# Patient Record
Sex: Male | Born: 1960 | Race: White | Hispanic: No | Marital: Married | Smoking: Never smoker
Health system: Southern US, Community
[De-identification: ages and names within clinical notes are randomized; demographics above are authoritative.]

---

## 2017-01-26 ENCOUNTER — Ambulatory Visit (INDEPENDENT_AMBULATORY_CARE_PROVIDER_SITE_OTHER): Payer: 59

## 2017-01-26 ENCOUNTER — Encounter: Payer: Self-pay | Admitting: Sports Medicine

## 2017-01-26 ENCOUNTER — Ambulatory Visit (INDEPENDENT_AMBULATORY_CARE_PROVIDER_SITE_OTHER): Payer: 59 | Admitting: Sports Medicine

## 2017-01-26 VITALS — BP 136/92 | HR 81 | Ht 74.0 in | Wt 211.4 lb

## 2017-01-26 DIAGNOSIS — M6259 Muscle wasting and atrophy, not elsewhere classified, multiple sites: Secondary | ICD-10-CM

## 2017-01-26 DIAGNOSIS — M545 Low back pain, unspecified: Secondary | ICD-10-CM

## 2017-01-26 DIAGNOSIS — M25562 Pain in left knee: Secondary | ICD-10-CM

## 2017-01-26 DIAGNOSIS — M6258 Muscle wasting and atrophy, not elsewhere classified, other site: Secondary | ICD-10-CM

## 2017-01-26 MED ORDER — DICLOFENAC SODIUM 2 % TD SOLN: 1.0000 "application " | Freq: Two times a day (BID) | TRANSDERMAL | 2 refills | Status: AC

## 2017-01-26 NOTE — Progress Notes (Signed)
Walter Cameron - 56 y.o. male MRN [id number]  Date of birth: 1961/03/10  Office Visit Note: Visit Date: 01/26/2017 [facility] Referred by: Medicine, [facility]*  Subjective: Chief Complaint  Patient presents with  . pain in knee    left   HPI: Patient reports for evaluation of left generalized knee pain.  He reports exquisite amount of pain in his low back and left leg that occurred approximately 2 months ago.  He had good resolution of the symptoms with chiropractic care is no longer having significant pain in his back hip or leg.  He has however noticed focal significant atrophy and weakness in the left leg especially within the left VMO and has had difficulty with one leg and knee extensions are he has had a decreased weight progressively over the past several weeks.  He is having occasional discomfort related more so to weakness with the left knee without overt locking or giving way.  He has not tried taking any specific medications given overall only minimal pain.  He has never had any issues like this before.  Only intermittent problems with his back in the past however once again no pain at this time.  He has noticed weakness going up and down steps as well as with knee extension.  He is quite fit and active and does workout on a daily basis.  He does report having a quite large femoral hernia that has previously been diagnosed although he has been putting off intervention due to lack of discomfort or pain.  He denies any nausea or vomiting.  No changes in bowel or bladder.  Does not think the hernia has anything to do with what is going on but did want to make sure we were aware of this condition.  ROS: Denies fevers, chills, recent weight gain or weight loss.  No night sweats. No significant nighttime awakenings due to this issue.  Otherwise per HPI.  Objective:  VS:  HT:6\' 2"  (188 cm)   WT:211 lb 6.4 oz (95.9 kg)  BMI:27.2    BP:(!) 136/92   HR:81bpm  TEMP: ( )  RESP:98 % Physical Exam: GENERAL:  WDWN, NAD, Non-toxic appearing PSYCH:  Alert & appropriately interactive  Not depressed or anxious appearing LEFT KNEE AND BACK:   No significant rashes/lesions/ulcerations overlying the legs.  No significant bruising or scarring  He has marked atrophy of the left VMO with generalized atrophy of the entire leg at the quad level.    No significant pretibial edema.  No clubbing or cyanosis.  DP & PT pulses 2+/4.  LE Sensation intact to light touch although he does have a slight dysesthesia in the anterior thigh to light touch.  He has very good (1 cm) 2 point discrimination in the L1 and L2 dermatomes bilaterally.  Overall joint is well aligned, no significant deformity.    No significant effusion.    ROM: 0 to 120.     Extensor mechanism intact however he has 3 out of 5 weakness with hip flexion.  Does have a difficult time performing one legged squat although this is nonpainful for him.  No significant medial or lateral joint line tenderness.    Stable to varus/valgus strain & anterior/posterior drawer.  Normal Lachman's.    Difficulty with [facility] although no significant pain or mechanical symptoms.  Negative McMurray's.  Negative patellar grind.  No pain with femoral stretch test or straight leg raise. Abdomen/pelvic:  He does have a easily  reproducible inguinal hernia with possible femoral component.  This is nonpainful and easily reduces with rest.  Imaging & Procedures: Dg Thoracic Spine W/swimmers  Result Date: 01/26/2017 CLINICAL DATA:  Back pain. EXAM: THORACIC SPINE - 3 VIEWS COMPARISON:  No recent prior . FINDINGS: Diffuse degenerative change thoracic spine. Minimal compression of lower thoracic vertebral bodies cannot be excluded. Age is undetermined . No prominent compression fracture noted . IMPRESSION: 1. Minimal compression of lower thoracic vertebral body compression fractures cannot be  excluded. Age is undetermined. No prominent compression fracture noted. 2. Diffuse multilevel degenerative change. Electronically Signed   By: Maisie Fus  Register   On: 01/26/2017 10:33   Dg Lumbar Spine Complete  Result Date: 01/26/2017 CLINICAL DATA:  Intermittent bilateral lower back and left leg pain. EXAM: LUMBAR SPINE - COMPLETE 4+ VIEW COMPARISON:  None. FINDINGS: No acute fracture or spondylolisthesis is noted. Moderate degenerative disc disease is noted at L1-2 with anterior osteophyte formation. 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 Right-sided pars defect of L5. No acute abnormality seen in the lumbar spine. Electronically Signed   By: Lupita Raider, M.D.   On: 01/26/2017 09:57   Assessment & Plan: Problem List Items Addressed This Visit    Acute bilateral low back pain without sciatica - Primary    Patient had acute onset of pain approximately 2 months ago with no progressive worsening of left knee pain and noted atrophy of the left leg.  Given he is completely asymptomatic but having worsening atrophy as well as worsening strength I am concerned for potential significant nerve root impingement.  I do not see any signs of any type of upper motor neuron disorder.  He does have a very large left inguinal/femoral hernia but given no pain with direct palpation or femoral stretch test do not suspect this is causing acute compression of the femoral nerve although this would be within the realm of possibility.    I would like to begin with a nerve conduction study/EMG of the left lower extremity to ensure that there is no significant denervation injury resulting in the atrophy.  Pending findings on EMG/nerve conduction study would consider further advanced imaging of his lumbar spine .      Relevant Medications   Diclofenac Sodium (PENNSAID) 2 % SOLN   Other Relevant Orders   DG Lumbar Spine Complete (Completed)   DG Thoracic Spine  W/Swimmers (Completed)   Ambulatory referral to Physical Medicine Rehab   Left knee pain    No focal or reproducible mechanical symptoms per history or on exam.  I suspect this is related more to some muscle imbalance with the resultant VMO atrophy.  After further evaluation of the atrophy could consider further diagnostic workup of the knee from a structural standpoint if persistent symptoms without explanation.  He is only having minimal symptoms we will let him try a sample of Pennsaid to see if this is helpful as well.  Instructions were reviewed.      Muscle atrophy of lower extremity   Relevant Medications   Diclofenac Sodium (PENNSAID) 2 % SOLN   Other Relevant Orders   DG Lumbar Spine Complete (Completed)   DG Thoracic Spine W/Swimmers (Completed)   Ambulatory referral to Physical Medicine Rehab      Follow-up: Return for Also, we will call you about your results.   Past Medical/Family/Surgical/Social History: Medications & Allergies reviewed per EMR Patient Active Problem List   Diagnosis  Date Noted  . Muscle atrophy of lower extremity 01/27/2017  . Acute bilateral low back pain without sciatica 01/27/2017  . Left knee pain 01/27/2017   No past medical history on file. No family history on file. No past surgical history on file. Social History   Occupational History  . Not on file.   Social History Main Topics  . Smoking status: Never Smoker  . Smokeless tobacco: Never Used  . Alcohol use Not on file  . Drug use: Unknown  . Sexual activity: Not on file

## 2017-01-26 NOTE — Patient Instructions (Signed)
We are setting up for nerve conduction study to further evaluate your muscle weakness.  You can try using the topical medicine that I gave you twice per day to see if this is helpful.

## 2017-01-27 DIAGNOSIS — M6258 Muscle wasting and atrophy, not elsewhere classified, other site: Secondary | ICD-10-CM | POA: Insufficient documentation

## 2017-01-27 DIAGNOSIS — M545 Low back pain, unspecified: Secondary | ICD-10-CM | POA: Insufficient documentation

## 2017-01-27 DIAGNOSIS — M25562 Pain in left knee: Secondary | ICD-10-CM | POA: Insufficient documentation

## 2017-01-27 NOTE — Assessment & Plan Note (Signed)
Patient had acute onset of pain approximately 2 months ago with no progressive worsening of left knee pain and noted atrophy of the left leg.  Given he is completely asymptomatic but having worsening atrophy as well as worsening strength I am concerned for potential significant nerve root impingement.  I do not see any signs of any type of upper motor neuron disorder.  He does have a very large left inguinal/femoral hernia but given no pain with direct palpation or femoral stretch test do not suspect this is causing acute compression of the femoral nerve although this would be within the realm of possibility.    I would like to begin with a nerve conduction study/EMG of the left lower extremity to ensure that there is no significant denervation injury resulting in the atrophy.  Pending findings on EMG/nerve conduction study would consider further advanced imaging of his lumbar spine .

## 2017-01-27 NOTE — Assessment & Plan Note (Signed)
No focal or reproducible mechanical symptoms per history or on exam.  I suspect this is related more to some muscle imbalance with the resultant VMO atrophy.  After further evaluation of the atrophy could consider further diagnostic workup of the knee from a structural standpoint if persistent symptoms without explanation.  He is only having minimal symptoms we will let him try a sample of Pennsaid to see if this is helpful as well.  Instructions were reviewed.

## 2017-02-09 ENCOUNTER — Encounter (INDEPENDENT_AMBULATORY_CARE_PROVIDER_SITE_OTHER): Payer: Self-pay | Admitting: Physical Medicine and Rehabilitation

## 2017-02-19 ENCOUNTER — Ambulatory Visit (INDEPENDENT_AMBULATORY_CARE_PROVIDER_SITE_OTHER): Payer: 59 | Admitting: Physical Medicine and Rehabilitation

## 2017-02-19 ENCOUNTER — Encounter (INDEPENDENT_AMBULATORY_CARE_PROVIDER_SITE_OTHER): Payer: Self-pay | Admitting: Physical Medicine and Rehabilitation

## 2017-02-19 DIAGNOSIS — R202 Paresthesia of skin: Secondary | ICD-10-CM

## 2017-02-19 NOTE — Progress Notes (Signed)
Walter Cameron - 56 y.o. male MRN [id number]  Date of birth: 04-01-1961  Office Visit Note: Visit Date: 02/19/2017 PCP: Andrena Mews, DO Referred by: Medicine, [facility]*  Subjective: Chief Complaint  Patient presents with  . Left Leg - Tingling   HPI: Mr. Vicente Masson is a pleasant and active 56 year old right-hand dominant but left leg dominant gentleman who comes in today with complaints of weakness and atrophy of the left quadriceps muscle. He reports that 2-3 months ago he was doing been over rows as part of her work out and felt a pull muscle in the lower back and buttock region. He had chiropractic care along with dry needling. He states that the dry needling essentially made the pain in the left lower back go away completely. Since that time he noticed progressive weakness in the left leg where he was having a hard time doing leg extensions and feeling weaker. The pain is completely resolved but he did for weaker in the left leg and knee. He has been running some lately. He has continued his workouts. He does feel like he has gotten some stronger. He has had a history of mild back problems in the past off and on. He has never had any lumbar spine surgery or radicular complaints. He does not have any paresthesias down the legs or focal weakness in the feet but he does report a bit of a tingly sensation on the medial knee.Marland Kitchen He is not a diabetic. He is otherwise healthy. He has not had prior electrodiagnostic studies.    Left lower extremity. Had some pain, now tingling sensation medial left knee. Has been having symptoms since early February when he pulled a muscle in his upper glute. Had dry needling which helped with the pain, but still has the tingling sensation. ROS Otherwise per HPI.  Assessment & Plan: Visit Diagnoses:  1. Paresthesia of skin     Plan: No additional findings.  Impression: The above electrodiagnostic study is ABNORMAL and reveals evidence of moderate to  severe subacute L3 radiculopathy on the left.  There is no significant electrodiagnostic evidence of any other focal nerve entrapment. Given the lack of symptoms in the full femoral nerve distribution, a femoral nerve, nerve conduction study was not performed. In hindsight, lumbar paraspinal EMG may have increased the specificity of the test. Clinically, the symptoms fit more with a radiculopathy. Neurogenic amyotrophy cannot be completely ruled out but should recover given the findings on this test.  Recommendations: 1.  Follow-up with referring physician. 2.  Continue current management of symptoms. Repeat electrodiagnostic study and 2-3 months of symptoms persist or progress. 3.  Suggest lumbar spine MRI to confirm source of radiculopathy. With continued improvement of the patient's strength, no red flag symptoms and the patient having really no pain at this point, watchful waiting would also be acceptable. Prognostically, he should regain a lot of strength over time.   Meds & Orders: No orders of the defined types were placed in this encounter.   Orders Placed This Encounter  Procedures  . NCV with EMG (electromyography)    Follow-up: Return for Dr. Berline Chough.   Procedures: No procedures performed  EMG & NCV Findings: All nerve conduction studies (as indicated in the following tables) were within normal limits.    The muscle scoring table definition stored in the current test does not match the sentence generator setup.  The sentence could not be generated.  Impression: The above electrodiagnostic study is ABNORMAL and reveals evidence  of moderate to severe subacute L3 radiculopathy on the left.  There is no significant electrodiagnostic evidence of any other focal nerve entrapment. Given the lack of symptoms in the full femoral nerve distribution, a femoral nerve, nerve conduction study was not performed. In hindsight, lumbar paraspinal EMG may have increased the specificity of the test.  Clinically, the symptoms fit more with a radiculopathy. Neurogenic amyotrophy cannot be completely ruled out but should recover given the findings on this test.  Recommendations: 1.  Follow-up with referring physician. 2.  Continue current management of symptoms. Repeat electrodiagnostic study and 2-3 months of symptoms persist or progress. 3.  Suggest lumbar spine MRI to confirm source of radiculopathy. With continued improvement of the patient's strength, no red flag symptoms and the patient having really no pain at this point, watchful waiting would also be acceptable.      Nerve Conduction Studies Motor Summary Table   Stim Site NR Onset (ms) Norm Onset (ms) O-P Amp (mV) Norm O-P Amp Site1 Site2 Delta-0 (ms) Dist (cm) Vel (m/s) Norm Vel (m/s)  Left Fibular Motor (Ext Dig Brev)  30.4C  Ankle    3.4 <6.1 7.3 >2.5 B Fib Ankle 8.3 38.0 46 >38  B Fib    11.7  6.7  Poplt B Fib 2.7 12.0 44 >40  Poplt    14.4  5.1         Left Tibial Motor (Abd Hall Brev)  29.9C  Ankle    4.3 <6.1 16.5 >3.0 Knee Ankle 10.2 46.0 45 >35  Knee    14.5  10.1           EMG   Side Muscle Nerve Root Ins Act Fibs Psw Amp Dur Poly Recrt Int Dennie Bible Comment  Left AntTibialis Dp Br Peron L4-5 Nml Nml Nml Nml Nml 0 Nml Nml   Left Fibularis Longus  Sup Br Peron L5-S1 Nml Nml Nml Nml Nml 0 Nml Nml   Left MedGastroc Tibial S1-2 Nml Nml Nml Nml Nml 0 Nml Nml   Left VastusMed Femoral L2-4 *Incr Nml Nml *Incr Nml 0 *Reduced Nml   Left BicepsFemS Sciatic L5-S1 Nml Nml Nml Nml Nml 0 Nml Nml   Left VastusLat Femoral L2-4 *Incr *3+ *3+ *Incr Nml 0 *Reduced Nml   Left RectFemoris Femoral L2-4 *Incr *2+ *2+ *Incr Nml 0 *Reduced Nml     Nerve Conduction Studies Motor Left/Right Comparison   Stim Site L Lat (ms) R Lat (ms) L-R Lat (ms) L Amp (mV) R Amp (mV) L-R Amp (%) Site1 Site2 L Vel (m/s) R Vel (m/s) L-R Vel (m/s)  Fibular Motor (Ext Dig Brev)  30.4C  Ankle 3.4   7.3   B Fib Ankle 46    B Fib 11.7   6.7   Poplt B Fib  44    Poplt 14.4   5.1         Tibial Motor (Abd Hall Brev)  29.9C  Ankle 4.3   16.5   Knee Ankle 45    Knee 14.5   10.1            Waveforms:       Clinical History: No specialty comments available.  He reports that he has never smoked. He has never used smokeless tobacco. No results for input(s): HGBA1C, LABURIC in the last 8760 hours.  Objective:  VS:  HT:    WT:   BMI:     BP:   HR: bpm  TEMP: ( )  RESP:  Physical Exam  Musculoskeletal:  The patient is a tall fairly fit individual who ambulates without aid with a normal gait. Inspection of both thighs does show atrophy of the rectus femoris and vastus medialis on the left compared to right. He reports that he is left leg dominant. There is normal bulk and muscle tone symmetrically below the knee bilaterally. He has no edema bilaterally. He has 5 out of 5 strength with dorsiflexion plantarflexion EHL bilaterally. He has no clonus bilaterally has downgoing toes. There are no observed fasciculations. He is mildly weaker with the left hip and knee extension compared to right but he is fairly strong. There is normal intact sensation to light touch.    Ortho Exam Imaging: No results found.  Past Medical/Family/Surgical/Social History: Medications & Allergies reviewed per EMR Patient Active Problem List   Diagnosis Date Noted  . Muscle atrophy of lower extremity 01/27/2017  . Acute bilateral low back pain without sciatica 01/27/2017  . Left knee pain 01/27/2017   History reviewed. No pertinent past medical history. History reviewed. No pertinent family history. History reviewed. No pertinent surgical history. Social History   Occupational History  . Not on file.   Social History Main Topics  . Smoking status: Never Smoker  . Smokeless tobacco: Never Used  . Alcohol use Not on file  . Drug use: Unknown  . Sexual activity: Not on file

## 2017-02-22 NOTE — Procedures (Signed)
EMG & NCV Findings: All nerve conduction studies (as indicated in the following tables) were within normal limits.    The muscle scoring table definition stored in the current test does not match the sentence generator setup.  The sentence could not be generated.  Impression: The above electrodiagnostic study is ABNORMAL and reveals evidence of moderate to severe subacute L3 radiculopathy on the left.  There is no significant electrodiagnostic evidence of any other focal nerve entrapment. Given the lack of symptoms in the full femoral nerve distribution, a femoral nerve, nerve conduction study was not performed. In hindsight, lumbar paraspinal EMG may have increased the specificity of the test. Clinically, the symptoms fit more with a radiculopathy. Neurogenic amyotrophy cannot be completely ruled out but should recover given the findings on this test.  Recommendations: 1.  Follow-up with referring physician. 2.  Continue current management of symptoms. Repeat electrodiagnostic study and 2-3 months of symptoms persist or progress. 3.  Suggest lumbar spine MRI to confirm source of radiculopathy. With continued improvement of the patient's strength, no red flag symptoms and the patient having really no pain at this point, watchful waiting would also be acceptable.      Nerve Conduction Studies Motor Summary Table   Stim Site NR Onset (ms) Norm Onset (ms) O-P Amp (mV) Norm O-P Amp Site1 Site2 Delta-0 (ms) Dist (cm) Vel (m/s) Norm Vel (m/s)  Left Fibular Motor (Ext Dig Brev)  30.4C  Ankle    3.4 <6.1 7.3 >2.5 B Fib Ankle 8.3 38.0 46 >38  B Fib    11.7  6.7  Poplt B Fib 2.7 12.0 44 >40  Poplt    14.4  5.1         Left Tibial Motor (Abd Hall Brev)  29.9C  Ankle    4.3 <6.1 16.5 >3.0 Knee Ankle 10.2 46.0 45 >35  Knee    14.5  10.1           EMG   Side Muscle Nerve Root Ins Act Fibs Psw Amp Dur Poly Recrt Int Dennie Bible Comment  Left AntTibialis Dp Br Peron L4-5 Nml Nml Nml Nml Nml 0 Nml Nml     Left Fibularis Longus  Sup Br Peron L5-S1 Nml Nml Nml Nml Nml 0 Nml Nml   Left MedGastroc Tibial S1-2 Nml Nml Nml Nml Nml 0 Nml Nml   Left VastusMed Femoral L2-4 *Incr Nml Nml *Incr Nml 0 *Reduced Nml   Left BicepsFemS Sciatic L5-S1 Nml Nml Nml Nml Nml 0 Nml Nml   Left VastusLat Femoral L2-4 *Incr *3+ *3+ *Incr Nml 0 *Reduced Nml   Left RectFemoris Femoral L2-4 *Incr *2+ *2+ *Incr Nml 0 *Reduced Nml     Nerve Conduction Studies Motor Left/Right Comparison   Stim Site L Lat (ms) R Lat (ms) L-R Lat (ms) L Amp (mV) R Amp (mV) L-R Amp (%) Site1 Site2 L Vel (m/s) R Vel (m/s) L-R Vel (m/s)  Fibular Motor (Ext Dig Brev)  30.4C  Ankle 3.4   7.3   B Fib Ankle 46    B Fib 11.7   6.7   Poplt B Fib 44    Poplt 14.4   5.1         Tibial Motor (Abd Hall Brev)  29.9C  Ankle 4.3   16.5   Knee Ankle 45    Knee 14.5   10.1            Waveforms:

## 2017-03-30 NOTE — Progress Notes (Signed)
Called patient left VM for him to call office to discuss results in further detail and check in with us for a potential follow up appt.

## 2017-03-30 NOTE — Progress Notes (Signed)
Can we call him and see if he was planning on following up.  I'd like to touch base with how he is doing

## 2017-04-01 NOTE — Progress Notes (Signed)
Called patient (2nd call) and patient asked to call back tomorrow as he was in meeting. Will call again tomorrow.

## 2017-04-07 NOTE — Progress Notes (Signed)
Unable to reach pt by phone, letter mailed.

## 2017-08-03 IMAGING — DX DG LUMBAR SPINE COMPLETE 4+V
5 series · 5 of 5 positions shown · non-contrast
Comparison: None.

CLINICAL DATA: Intermittent bilateral lower back and left leg pain.

EXAM:
LUMBAR SPINE - COMPLETE 4+ VIEW

[lumbar spine ap]
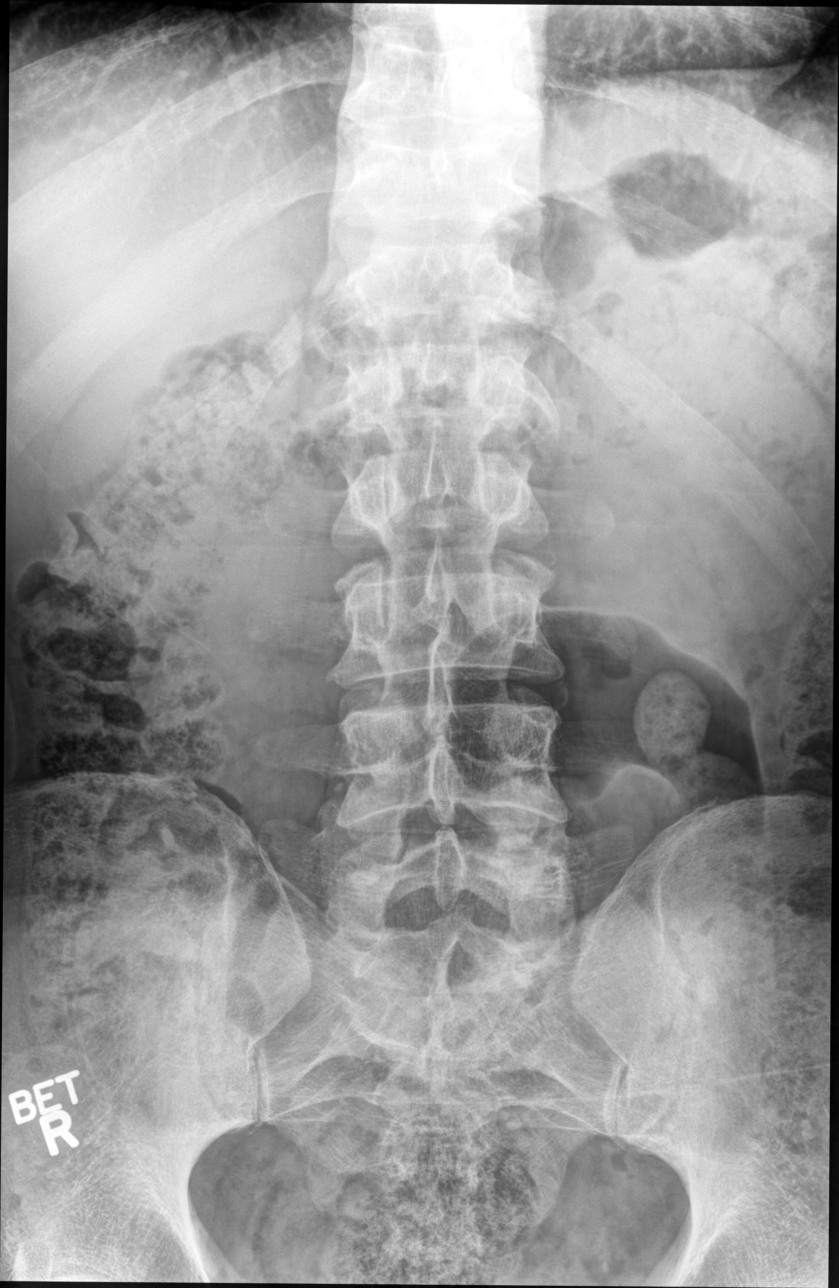

[lumbar spine oblique (1 of 2)]
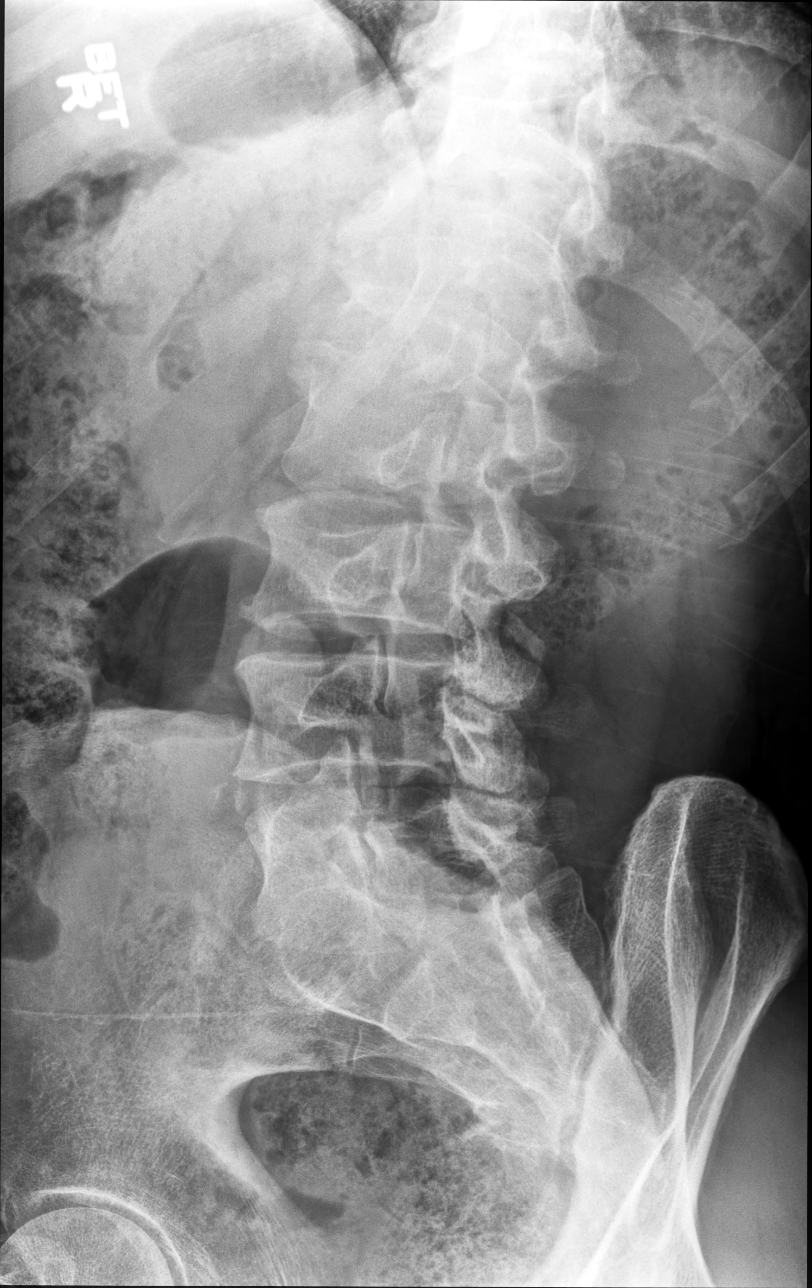

[lumbar spine oblique (2 of 2)]
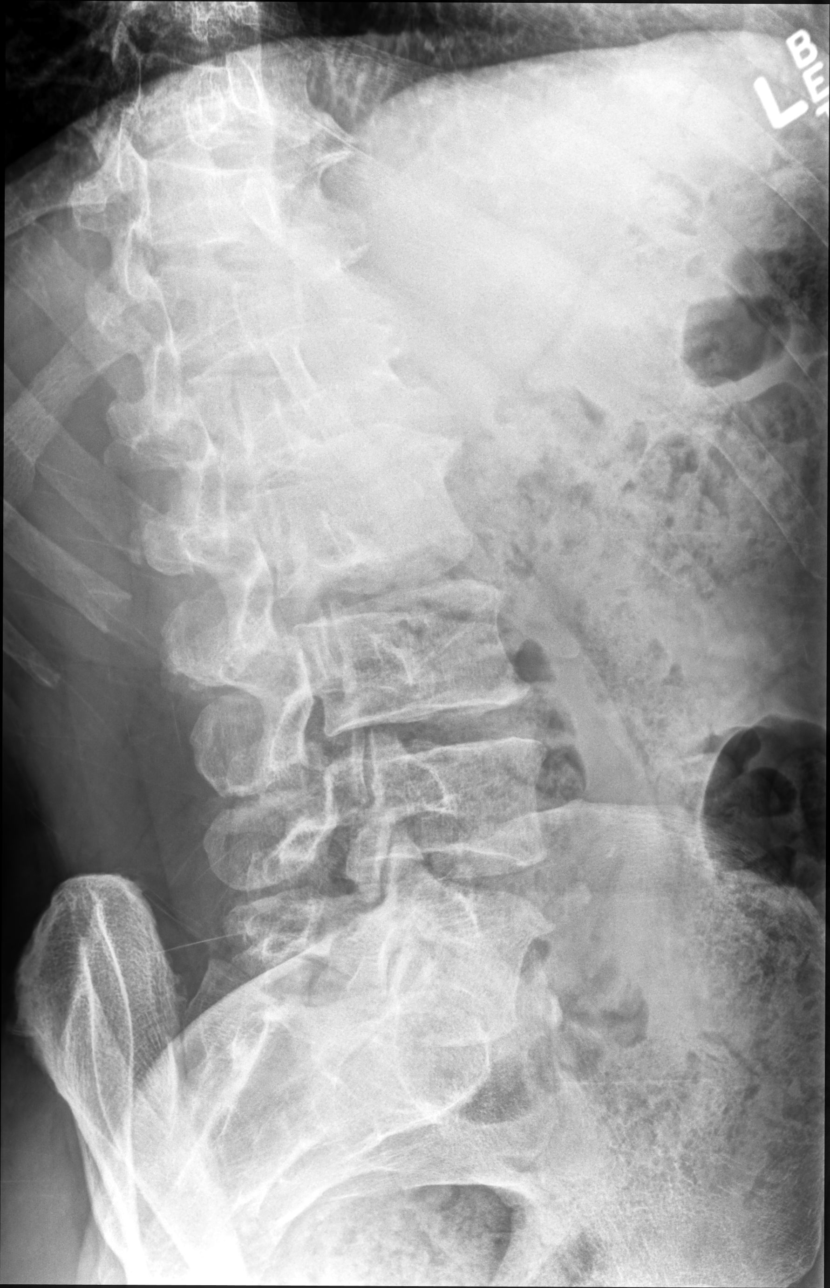

[lumbar spine lat]
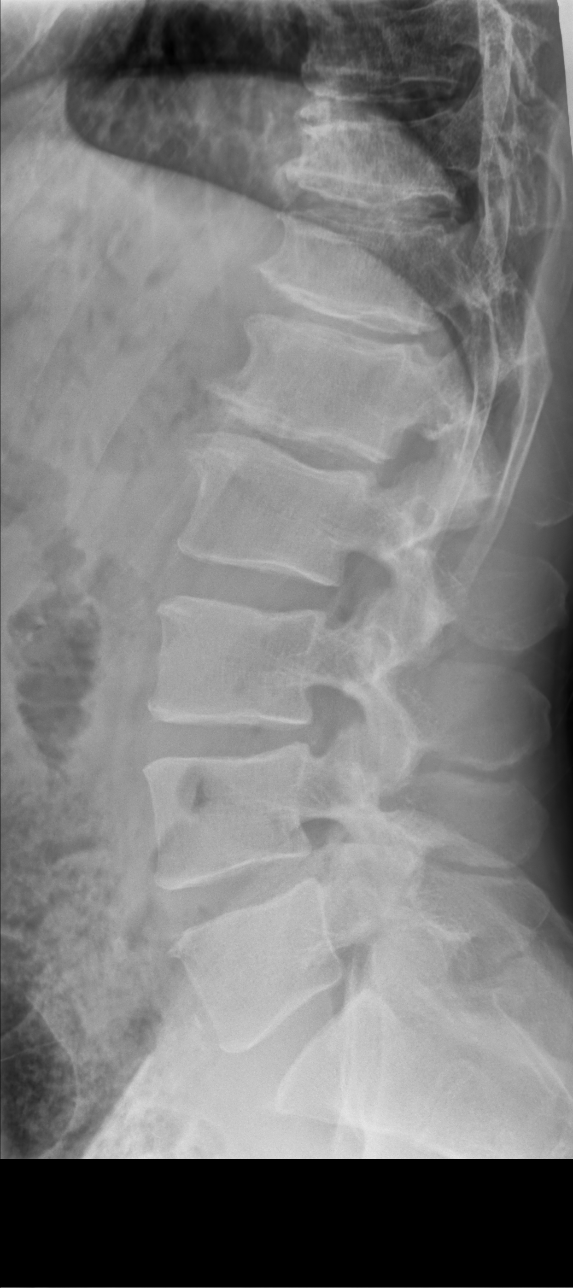

[sacrum 20° caudo-cranial ap]
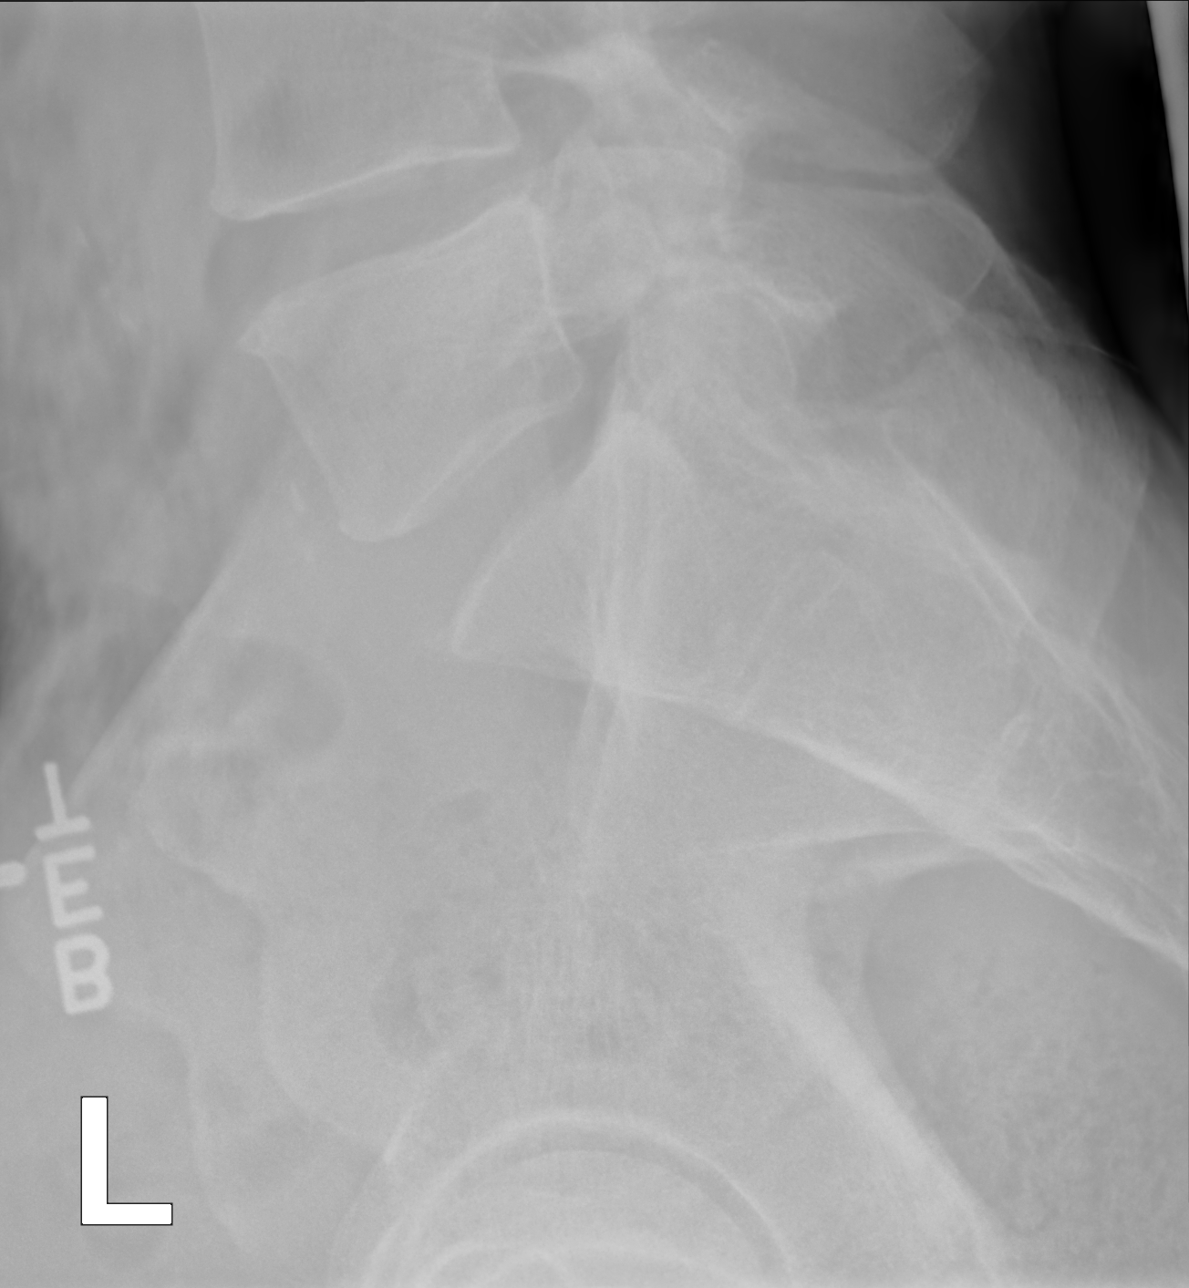

[5 of 5 positions shown; findings below may reference images not displayed]

FINDINGS: No acute fracture or spondylolisthesis is noted. Moderate
degenerative disc disease is noted at L1-2 with anterior osteophyte
formation. Pars defects of L5 is noted on the right.
IMPRESSION: Moderate degenerative disc disease of L1-2. Right-sided pars defect
of L5. No acute abnormality seen in the lumbar spine.
